# Patient Record
Sex: Female | Born: 1990 | Race: White | Hispanic: No | Marital: Single | Smoking: Current some day smoker
Health system: Southern US, Community
[De-identification: ages and names within clinical notes are randomized; demographics above are authoritative.]

## PROBLEM LIST (undated history)

## (undated) HISTORY — PX: NO PAST SURGERIES: SHX2092

---

## 2008-02-08 ENCOUNTER — Emergency Department: Payer: Self-pay | Admitting: Emergency Medicine

## 2010-10-08 ENCOUNTER — Emergency Department: Payer: Self-pay | Admitting: Emergency Medicine

## 2012-02-18 ENCOUNTER — Ambulatory Visit (INDEPENDENT_AMBULATORY_CARE_PROVIDER_SITE_OTHER): Payer: BC Managed Care – PPO | Admitting: Family Medicine

## 2012-02-18 VITALS — BP 98/78 | HR 75 | Temp 98.2°F | Resp 16 | Ht 67.0 in | Wt 131.0 lb

## 2012-02-18 DIAGNOSIS — J029 Acute pharyngitis, unspecified: Secondary | ICD-10-CM

## 2012-02-18 MED ORDER — AMOXICILLIN 875 MG PO TABS: 875.0000 mg | ORAL_TABLET | Freq: Two times a day (BID) | ORAL | Status: DC

## 2012-02-18 NOTE — Patient Instructions (Addendum)
Peritonsillar Cellulitis Peritonsillar cellulitis is an infection around a tonsil. This infection usually affects just one of the two tonsils. The result is a severe sore throat. Peritonsillar cellulitis can develop at any age. It often develops in individuals who have had frequent sore throats and who have frequently taken antibiotics. CAUSES  Peritonsillar cellulitis is usually caused by more than one type of germ (bacteria). SYMPTOMS  At first, it might seem like a regular sore throat. But a sore throat from peritonsillar cellulitis does not go away in a few days. Instead, it gets worse.  Early symptoms of peritonsillar cellulitis may include:  Fever and/or chills.  A throat that is sore on one side only.  Pain in one ear.  Pain when swallowing.  Feeling more tired than usual.  Later symptoms may include:  Severe pain when swallowing.  Drooling.  Trouble opening the mouth wide.  Bad breath.  Voice changes. DIAGNOSIS  In most cases, your caregiver can make the diagnosis by knowing your symptoms, examining your throat and getting a throat culture. Blood samples may also help to determine the cause of your sore throat. TREATMENT  This is not an ordinary sore throat. It is a condition that needs to be treated quickly. If it is not treated, swelling and pus (an abcess) can develop.  Peritonsillar cellulitis is usually treated with antibiotics. These infections require oral antibiotics for a full 10 days and/or antibiotics given into the vein (intravenous, IV).  Medications may be prescribed for pain or fever.  Sometimes, medications that fight swelling (steroids) are prescribed.  If an abscess has formed, the abscess may need to be drained.  Individuals who have repeated cases of peritonsillar cellulitis may need an operation to remove the tonsils (tonsillectomy). HOME CARE INSTRUCTIONS   Take antibiotics as directed by your caregiver. Take all the antibiotics, even if you  start to feel better.  Some pain is normal with this condition. Take pain medication as directed by your caregiver. Do not take any other pain medications unless approved by your caregiver.  Gargle with warm salt water. Use 1 teaspoon (5 grams) salt mixed in 1 cup (250 mL) of warm water. Gargle for 30 seconds or more before spitting the solution out. Gargle 3 to 4 times a day or as needed. This may help ease pain and swelling.  A liquid or soft food diet may be necessary if it is hard to swallow.  It is important to drink fluids. Drink enough water and fluids to keep your urine clear or pale yellow.  Do not smoke.  Rest and get plenty of sleep.  If your caregiver has given you a follow-up appointment, it is very important to keep that appointment. Your caregiver will need to make sure that the infection is getting better. It is important to check that an abscess is not forming.  Return to work or school as directed by your caregiver. SEEK MEDICAL CARE IF:   Your swelling increases.  You have difficulty swallowing.  You are unable to take your antibiotic. SEEK IMMEDIATE MEDICAL CARE IF:   You have trouble breathing.  Your pain gets worse even after taking pain medicine.  You see pus around or near the tonsils.  Your voice changes.  You are drooling.  You cough up bloody sputum.  You are unable to swallow.  You have a fever. MAKE SURE YOU:   Understand these instructions.  Will watch your condition.  Will get help right away if you are   not doing well or get worse. Document Released: 07/24/2009 Document Revised: 07/22/2011 Document Reviewed: 07/24/2009 Exit[employer] Patient Information 2013 [employer].  

## 2012-02-18 NOTE — Progress Notes (Signed)
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  Associated with headaches.  Unknown whether fever present.  Decreased appetite, no nausea or vomiting.  Some cough since last night.  Objective:  NAD  TM's  Dull Oroph:  Red without exudates or swelling Neck:  Supple, mild ant. Cervical gland swelling Skin: warm and dry  Assessment:  Acute pharyngitis  1. Pharyngitis  Culture, Group A Strep, amoxicillin (AMOXIL) 875 MG tablet

## 2012-02-20 LAB — CULTURE, GROUP A STREP: Organism ID, Bacteria: NORMAL

## 2012-09-30 IMAGING — CT CT ABD-PELV W/ CM
1 of 2 series · 16 of 32 positions shown, 20 images · non-contrast
Comparison: none

REASON FOR EXAM: (1) RLQ PAIN; (2) RLQ PAIN
COMMENTS:

PROCEDURE:     CT  - CT ABDOMEN / PELVIS  W  - October 08, 2010  [DATE]
RESULT:     History: Right lower quadrant pain.

[Series 2: 3mm soft tissue · axial · 0.65mm/px · z∈[-508,-88]mm · 16 of 154 slices shown, 20 images]
[im 7/154  soft-tissue]
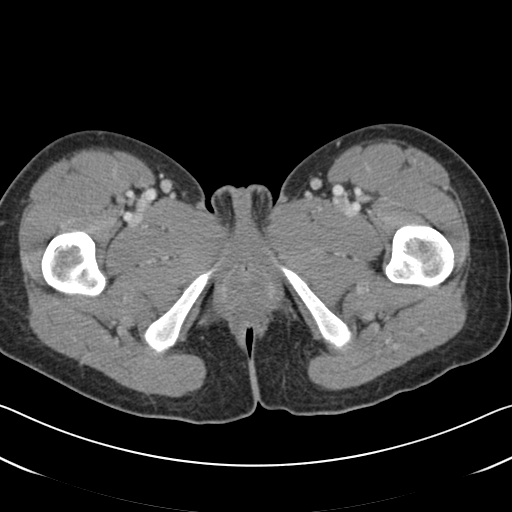
[im 7/154  bone]
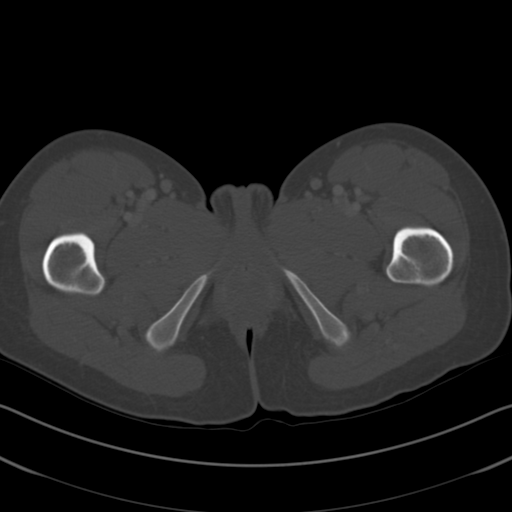
[im 20/154  soft-tissue]
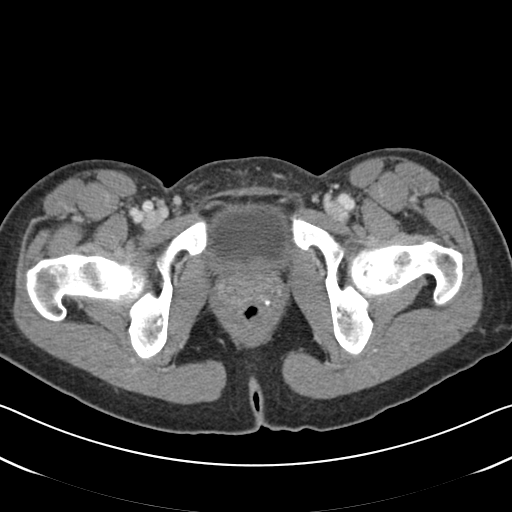
[im 32/154  soft-tissue]
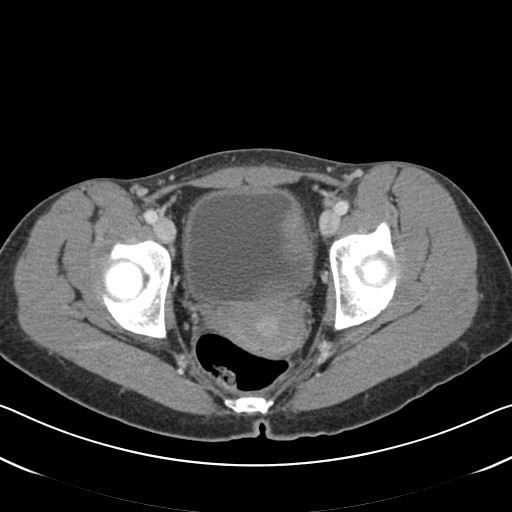
[im 39/154  soft-tissue]
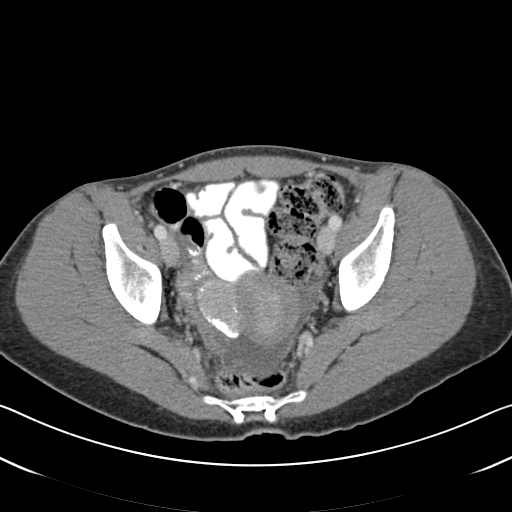
[im 52/154  soft-tissue]
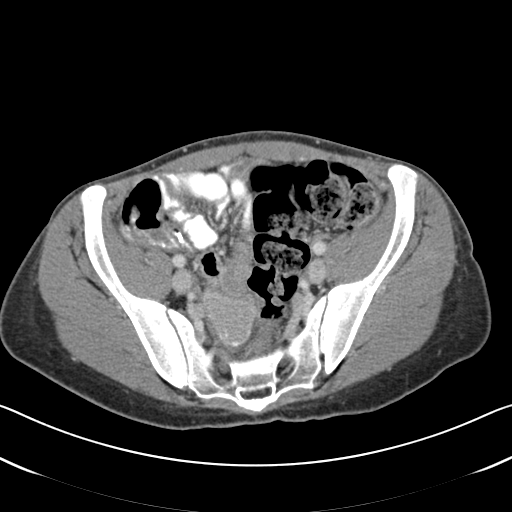
[im 64/154  soft-tissue]
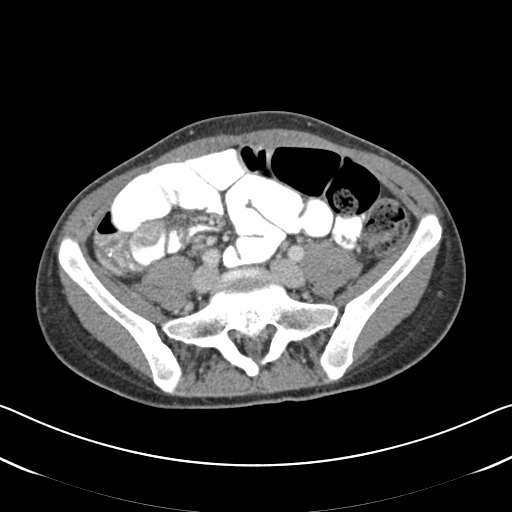
[im 71/154  soft-tissue]
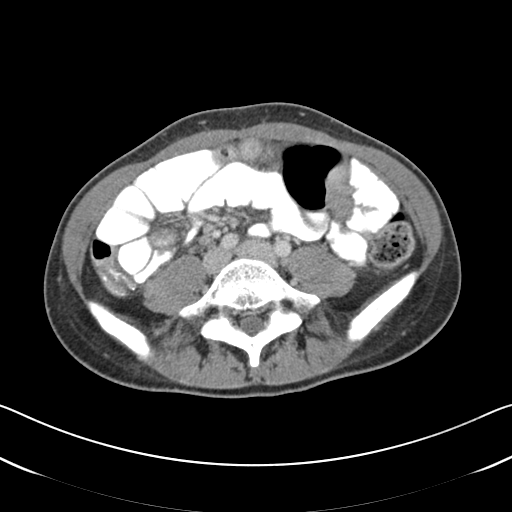
[im 83/154  soft-tissue]
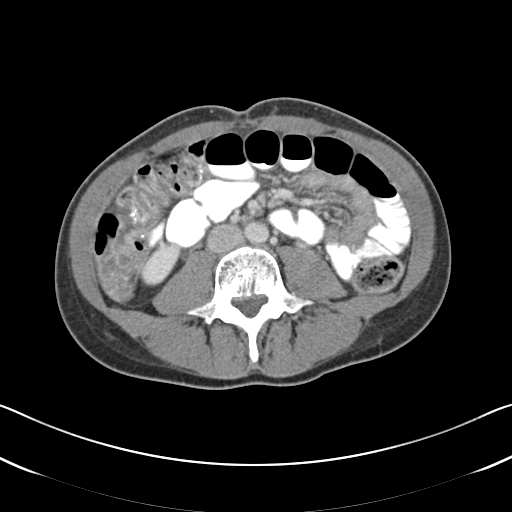
[im 90/154  soft-tissue]
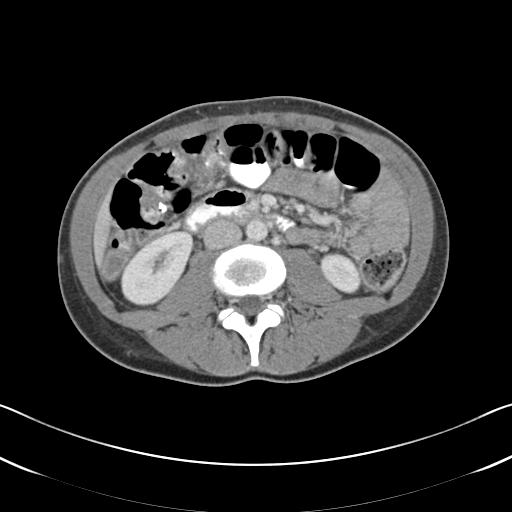
[im 90/154  bone]
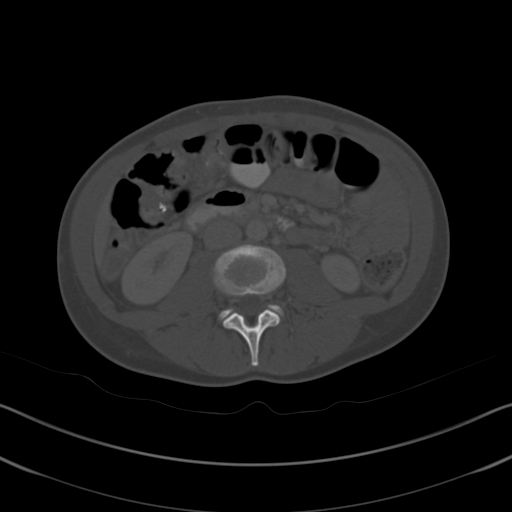
[im 103/154  soft-tissue]
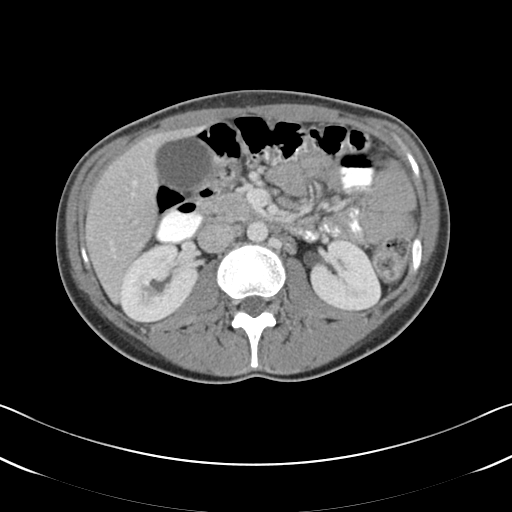
[im 115/154  soft-tissue]
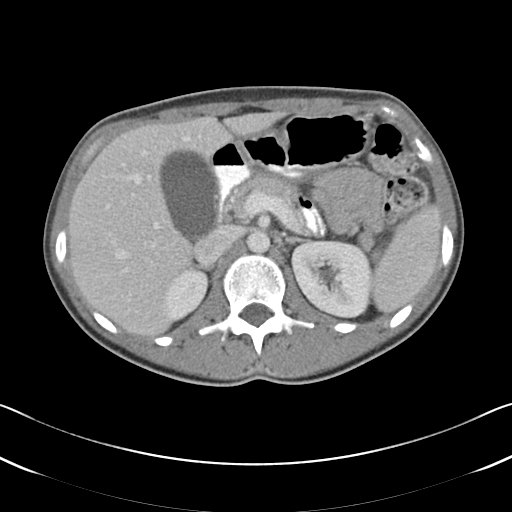
[im 122/154  soft-tissue]
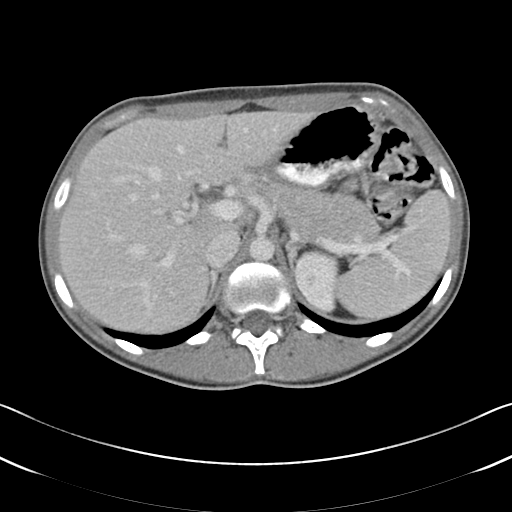
[im 128/154  lung]
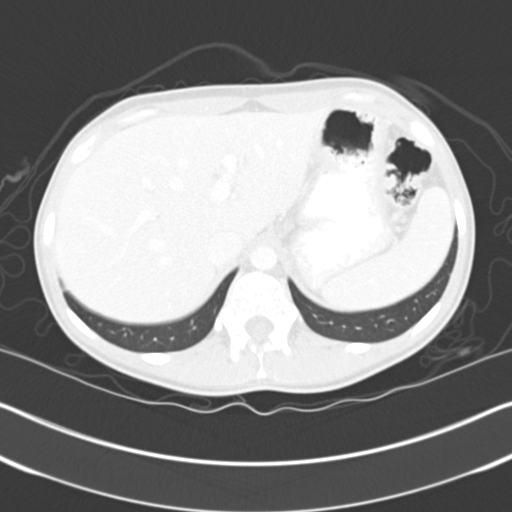
[im 134/154  soft-tissue]
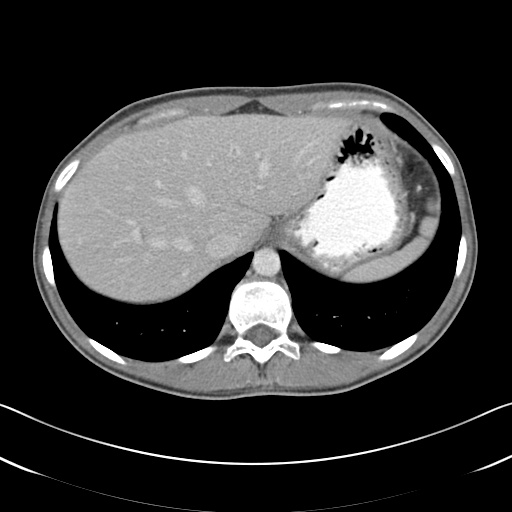
[im 134/154  lung]
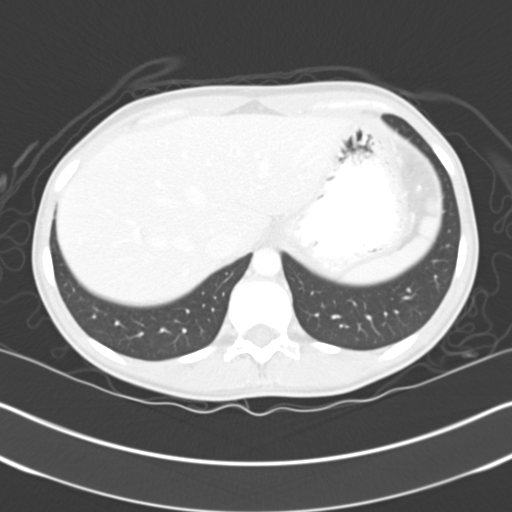
[im 141/154  lung]
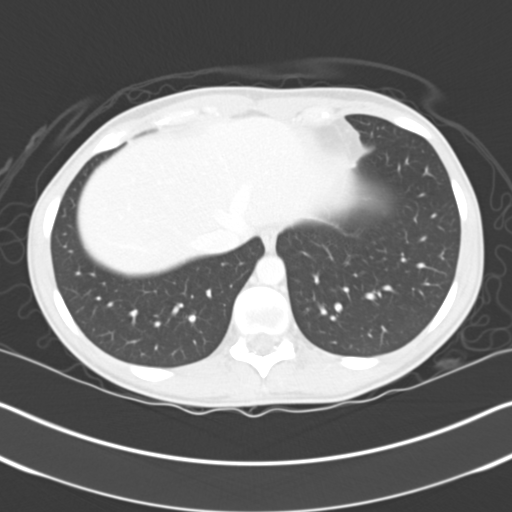
[im 147/154  soft-tissue]
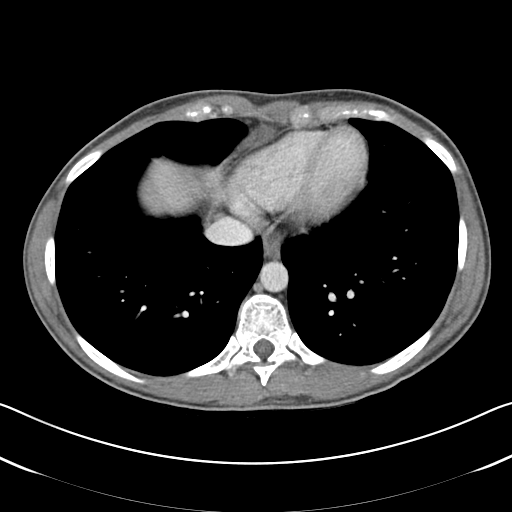
[im 147/154  lung]
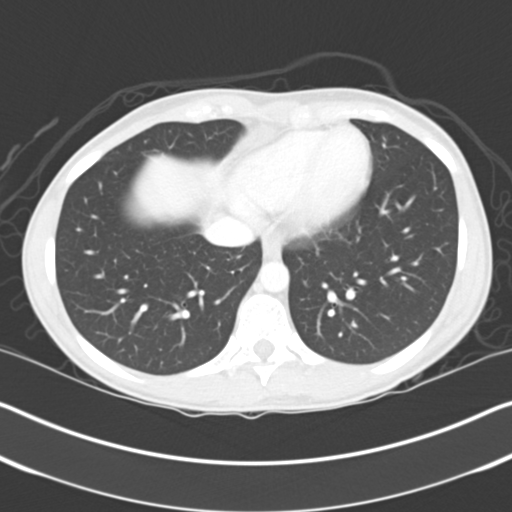

[16 of 32 positions shown; findings below may reference images not displayed]

FINDINGS: Standard CT obtained with 100 cc of 7sovue-TGC. Liver is normal.
Minimal biliary ductal prominence is noted. Gallbladder is nondistended.
Spleen is normal. Pancreas is normal. Adrenals normal. Kidneys normal. Free
pelvic fluid is noted. There is no bowel distention. The appendix is
difficult to visualize but what appears to be the appendix contains contrast
and air and is most likely normal. There is no evidence of bowel
obstruction. There is no free air. Lung bases clear. Aorta nondistended.
Stool is noted throughout the colon. Lung bases are clear.
IMPRESSION: Free fluid noted in the pelvis. Exam is otherwise
nonspecific.

## 2014-09-28 ENCOUNTER — Ambulatory Visit
Admission: EM | Admit: 2014-09-28 | Discharge: 2014-09-28 | Disposition: A | Discharge status: Home / Self Care | Payer: Managed Care, Other (non HMO) | Attending: Family Medicine | Admitting: Family Medicine

## 2014-09-28 DIAGNOSIS — N390 Urinary tract infection, site not specified: Secondary | ICD-10-CM

## 2014-09-28 LAB — URINALYSIS COMPLETE WITH MICROSCOPIC (ARMC ONLY)
BILIRUBIN URINE: NEGATIVE
GLUCOSE, UA: NEGATIVE mg/dL
Ketones, ur: NEGATIVE mg/dL
NITRITE: NEGATIVE
Protein, ur: 30 mg/dL — AB
Specific Gravity, Urine: 1.01 (ref 1.005–1.030)
Squamous Epithelial / LPF: NONE SEEN — AB
pH: 6.5 (ref 5.0–8.0)

## 2014-09-28 MED ORDER — CIPROFLOXACIN HCL 500 MG PO TABS: 500.0000 mg | ORAL_TABLET | Freq: Two times a day (BID) | ORAL | Status: DC

## 2014-09-28 MED ORDER — FLUCONAZOLE 150 MG PO TABS: 150.0000 mg | ORAL_TABLET | Freq: Every day | ORAL | Status: DC

## 2014-09-28 NOTE — ED Provider Notes (Signed)
CSN: [id number]     Arrival date & time 09/28/14  1309 History   First MD Initiated Contact with Patient 09/28/14 1332     Chief Complaint  Patient presents with  . Urinary Tract Infection   (Consider location/radiation/quality/duration/timing/severity/associated sxs/prior Treatment) HPI Comments: Patient presents with a h/o frequent urination since last night. Denies any fevers, chills, nausea, vomiting, back pain. States has had 2 prior UTIs in the last 4-5 months.   The history is provided by the patient.    No past medical history on file. Past Surgical History  Procedure Laterality Date  . No past surgeries     No family history on file. History  Substance Use Topics  . Smoking status: Never Smoker   . Smokeless tobacco: Not on file  . Alcohol Use: 0.0 oz/week    0 Standard drinks or equivalent per week     Comment: occasionally   OB History    No data available     Review of Systems  Allergies  Omnicef  Home Medications   Prior to Admission medications   Medication Sig Start Date End Date Taking? Authorizing Provider  levonorgestrel-ethinyl estradiol (SEASONALE,INTROVALE,JOLESSA) 0.15-0.03 MG tablet Take 1 tablet by mouth daily.   Yes Historical Provider, MD  amoxicillin (AMOXIL) 875 MG tablet Take 1 tablet (875 mg total) by mouth 2 (two) times daily. 02/18/12   Elvina SidleKurt Lauenstein, MD  ciprofloxacin (CIPRO) 500 MG tablet Take 1 tablet (500 mg total) by mouth every 12 (twelve) hours. 09/28/14   Payton Mccallumrlando Alfard Cochrane, MD  fluconazole (DIFLUCAN) 150 MG tablet Take 1 tablet (150 mg total) by mouth daily. 09/28/14   Payton Mccallumrlando Tanita Palinkas, MD   BP 112/81 mmHg  Pulse 84  Temp(Src) 97.6 F (36.4 C) (Tympanic)  Resp 18  Ht 5\' 7"  (1.702 m)  Wt 130 lb (58.968 kg)  BMI 20.36 kg/m2  SpO2 100%  LMP 09/07/2014 Physical Exam  Constitutional: She appears well-developed and well-nourished. No distress.  Abdominal: Soft. Bowel sounds are normal. She exhibits no distension and no mass. There is  no tenderness. There is no rebound and no guarding.  No CVA tenderness  Skin: She is not diaphoretic.  Nursing note and vitals reviewed.   ED Course  Procedures (including critical care time) Labs Review Labs Reviewed  URINALYSIS COMPLETEWITH MICROSCOPIC [facility])  - Abnormal; Notable for the following:    APPearance CLOUDY (*)    Hgb urine dipstick 2+ (*)    Protein, ur 30 (*)    Leukocytes, UA 3+ (*)    Bacteria, UA FEW (*)    Squamous Epithelial / LPF NONE SEEN (*)    All other components within normal limits  URINE CULTURE    Imaging Review No results found.   MDM   1. UTI (lower urinary tract infection)    Discharge Medication List as of 09/28/2014  2:16 PM    START taking these medications   Details  ciprofloxacin (CIPRO) 500 MG tablet Take 1 tablet (500 mg total) by mouth every 12 (twelve) hours., Starting 09/28/2014, Until Discontinued, Normal    fluconazole (DIFLUCAN) 150 MG tablet Take 1 tablet (150 mg total) by mouth daily., Starting 09/28/2014, Until Discontinued, Normal      Plan: 1. Test/x-ray results and diagnosis reviewed with patient 2. rx as per orders; risks, benefits, potential side effects reviewed with patient 3. Recommend supportive treatment with increased fluids; establish care with PCP 4. F/u prn if symptoms worsen or don't improve    Payton Mccallumrlando Tyren Dugar, MD  09/28/14 1616 

## 2014-09-28 NOTE — Discharge Instructions (Signed)

## 2014-09-28 NOTE — ED Notes (Signed)
Patient states that she has had two UTI's in the last 5 months. Patient states that she is having urinary urgency with nocturia. Patient states that she thinks she is at the early stage. She states that she has been taking a preventative Cranberry pill.

## 2014-09-30 LAB — URINE CULTURE
Culture: >=100000
SPECIAL REQUESTS: NORMAL

## 2017-11-18 ENCOUNTER — Encounter: Payer: Self-pay | Admitting: Emergency Medicine

## 2017-11-18 ENCOUNTER — Other Ambulatory Visit: Payer: Self-pay

## 2017-11-18 ENCOUNTER — Ambulatory Visit
Admission: EM | Admit: 2017-11-18 | Discharge: 2017-11-18 | Disposition: A | Discharge status: Home / Self Care | Payer: BLUE CROSS/BLUE SHIELD | Attending: Family Medicine | Admitting: Family Medicine

## 2017-11-18 DIAGNOSIS — N9089 Other specified noninflammatory disorders of vulva and perineum: Secondary | ICD-10-CM | POA: Diagnosis not present

## 2017-11-18 DIAGNOSIS — Z113 Encounter for screening for infections with a predominantly sexual mode of transmission: Secondary | ICD-10-CM

## 2017-11-18 DIAGNOSIS — N898 Other specified noninflammatory disorders of vagina: Secondary | ICD-10-CM

## 2017-11-18 DIAGNOSIS — Z3202 Encounter for pregnancy test, result negative: Secondary | ICD-10-CM | POA: Diagnosis not present

## 2017-11-18 DIAGNOSIS — R300 Dysuria: Secondary | ICD-10-CM | POA: Diagnosis not present

## 2017-11-18 LAB — WET PREP, GENITAL
CLUE CELLS WET PREP: NONE SEEN
Sperm: NONE SEEN
Trich, Wet Prep: NONE SEEN
Yeast Wet Prep HPF POC: NONE SEEN

## 2017-11-18 LAB — URINALYSIS, COMPLETE (UACMP) WITH MICROSCOPIC
BILIRUBIN URINE: NEGATIVE
Glucose, UA: NEGATIVE mg/dL
Hgb urine dipstick: NEGATIVE
KETONES UR: NEGATIVE mg/dL
LEUKOCYTES UA: NEGATIVE
NITRITE: NEGATIVE
Protein, ur: NEGATIVE mg/dL
Specific Gravity, Urine: 1.02 (ref 1.005–1.030)
pH: 7 (ref 5.0–8.0)

## 2017-11-18 LAB — CHLAMYDIA/NGC RT PCR (ARMC ONLY)
CHLAMYDIA TR: NOT DETECTED
N GONORRHOEAE: NOT DETECTED

## 2017-11-18 LAB — PREGNANCY, URINE: PREG TEST UR: NEGATIVE

## 2017-11-18 MED ORDER — SULFAMETHOXAZOLE-TRIMETHOPRIM 800-160 MG PO TABS: 1.0000 | ORAL_TABLET | Freq: Two times a day (BID) | ORAL | 0 refills | Status: AC

## 2017-11-18 MED ORDER — VALACYCLOVIR HCL 1 G PO TABS: 1000.0000 mg | ORAL_TABLET | Freq: Two times a day (BID) | ORAL | 0 refills | Status: AC

## 2017-11-18 NOTE — Discharge Instructions (Signed)
Take medication as prescribed. Rest. Drink plenty of fluids. No sexual activity until all results received.   Follow up with your primary care physician this week as needed. Return to Urgent care for new or worsening concerns.

## 2017-11-18 NOTE — ED Provider Notes (Signed)
[facility] ____________________________________________  Time seen: Approximately 2:28 PM  I have reviewed the triage vital signs and the nursing notes.   HISTORY  Chief Complaint vaginal complaint  HPI Kendra Cruz is a 27 y.o. female presenting for evaluation of left-sided vaginal bumps that she reports has quickly came on in 2 days.  Denies any pain to the area or swelling.  States she does have some burning with urination but states unsure if it is from her urine or if it is from urine touching the skin.  Denies any vaginal discharge, vaginal odor, vaginal pain or pelvic pain.  Denies abdominal pain, back pain, fevers, recent sickness or known trigger.  Denies history of same in the past.  Reports sexually active with the same partner without changes.  On oral contraceptives, with maintained use.  Denies other aggravating or alleviating factors.  Reports otherwise feels well.  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 Denies recent sickness. Denies recent antibiotic use.    History reviewed. No pertinent past medical history. Denies  There are no active problems to display for this patient.   Past Surgical History:  Procedure Laterality Date  . NO PAST SURGERIES       No current facility-administered medications for this encounter.   Current Outpatient Medications:  .  levonorgestrel-ethinyl estradiol (SEASONALE,INTROVALE,JOLESSA) 0.15-0.03 MG tablet, Take 1 tablet by mouth daily., Disp: , Rfl:  .  sulfamethoxazole-trimethoprim (BACTRIM DS,SEPTRA DS) 800-160 MG tablet, Take 1 tablet by mouth 2 (two) times daily for 5 days., Disp: 10 tablet, Rfl: 0 .  valACYclovir (VALTREX) 1000 MG tablet, Take 1 tablet (1,000 mg total) by mouth 2 (two) times daily for 7 days., Disp: 14 tablet, Rfl: 0  Allergies Omnicef [cefdinir]  No family history on file.  Social History Social  History   Tobacco Use  . Smoking status: Never Smoker  Substance Use Topics  . Alcohol use: Yes    Alcohol/week: 0.0 oz    Comment: occasionally  . Drug use: No    Review of Systems Constitutional: No fever/chills Cardiovascular: Denies chest pain. Respiratory: Denies shortness of breath. Gastrointestinal: No abdominal pain.  No nausea, no vomiting.  No diarrhea.   Genitourinary: as above.  Musculoskeletal: Negative for back pain. Skin: Negative for rash.  ____________________________________________   PHYSICAL EXAM:  VITAL SIGNS: ED Triage Vitals  Enc Vitals Group     BP 11/18/17 1324 (!) 138/95     Pulse Rate 11/18/17 1324 80     Resp 11/18/17 1324 18     Temp 11/18/17 1324 99.1 F (37.3 C)     Temp Source 11/18/17 1324 Oral     SpO2 11/18/17 1324 100 %     Weight 11/18/17 1322 135 lb (61.2 kg)     Height 11/18/17 1322 5\' 8"  (1.727 m)     Head Circumference --      Peak Flow --      Pain Score 11/18/17 1322 0     Pain Loc --      Pain Edu? --      Excl. in GC? --     Constitutional: Alert and oriented. Well appearing and in no acute distress. ENT      Head: Normocephalic and atraumatic. Cardiovascular: Normal rate, regular rhythm. Grossly normal heart sounds.  Good peripheral circulation. Respiratory: Normal respiratory effort without tachypnea nor retractions. Breath sounds are clear and equal  bilaterally. No wheezes, rales, rhonchi. Gastrointestinal: Soft and nontender. No CVA tenderness. Pelvic: Pelvic exam completed with Mardella Layman RN at bedside as chaperone. External left inner labia: X4 5 less than 0.5 cm clustered ulcerative appearing lesions with mild surrounding erythema, no swelling, no other rash or skin changes noted.  Speculum: Mild whitish vaginal discharge, no bleeding, no foreign body. Musculoskeletal:  No midline cervical, thoracic or lumbar tenderness to palpation. Neurologic:  Normal speech and language.  Speech is normal. No gait instability.    Skin:  Skin is warm, dry and intact. No rash noted. Psychiatric: Mood and affect are normal. Speech and behavior are normal. Patient exhibits appropriate insight and judgment   ___________________________________________   LABS (all labs ordered are listed, but only abnormal results are displayed)  Labs Reviewed  WET PREP, GENITAL - Abnormal; Notable for the following components:      Result Value   WBC, Wet Prep HPF POC FEW (*)    All other components within normal limits  URINALYSIS, COMPLETE (UACMP) WITH MICROSCOPIC - Abnormal; Notable for the following components:   Bacteria, UA MANY (*)    All other components within normal limits  URINE CULTURE  CHLAMYDIA/NGC RT PCR ([facility] ONLY)  HSV CULTURE AND TYPING  PREGNANCY, URINE  HSV(HERPES SIMPLEX VRS) I + II AB-IGG  HSV(HERPES SIMPLEX VRS) I + II AB-IGM  RPR  HIV ANTIBODY (ROUTINE TESTING)  HEPATITIS PANEL, ACUTE   ____________________________________________  PROCEDURES Procedures   INITIAL IMPRESSION / ASSESSMENT AND PLAN / ED COURSE  Pertinent labs & imaging results that were available during my care of the patient were reviewed by me and considered in my medical decision making (see chart for details).  Well-appearing patient.  No acute distress.  Patient requests to have  STD testing including gonorrhea, chlamydia, trichomonas, herpes, HIV, syphilis and hepatitis.  Urine pregnancy test negative.  Urinalysis noted to have many bacteria, otherwise unremarkable urine, will culture urine.  Pelvic exam was completed.  Ulcerative appearing lesions, will empirically start on oral Valtrex.  Will also start on Bactrim as concerns of urine.  Will await results.  Encourage safe sex, pelvic rest, supportive care.Discussed indication, risks and benefits of medications with patient.  Discussed follow up with Primary care physician this week. Discussed follow up and return parameters including no resolution or any worsening  concerns. Patient verbalized understanding and agreed to plan.   ____________________________________________   FINAL CLINICAL IMPRESSION(S) / ED DIAGNOSES  Final diagnoses:  Vaginal lesion  Dysuria     ED Discharge Orders        Ordered    sulfamethoxazole-trimethoprim (BACTRIM DS,SEPTRA DS) 800-160 MG tablet  2 times daily     11/18/17 1449    valACYclovir (VALTREX) 1000 MG tablet  2 times daily     11/18/17 1449       Note: This dictation was prepared with Dragon dictation along with smaller phrase technology. Any transcriptional errors that result from this process are unintentional.         Renford Dills, NP 11/18/17 1527

## 2017-11-18 NOTE — ED Triage Notes (Signed)
Patient c/o bumps to vaginal area x 2 days ago. Denies pain or itching. Does have burning with urination.

## 2017-11-19 LAB — HEPATITIS PANEL, ACUTE
HCV Ab: <0.1 s/co ratio (ref 0.0–0.9)
HEP A IGM: NEGATIVE
HEP B C IGM: NEGATIVE
Hepatitis B Surface Ag: NEGATIVE

## 2017-11-19 LAB — HSV(HERPES SIMPLEX VRS) I + II AB-IGG
HSV 1 Glycoprotein G Ab, IgG: <0.91 index (ref 0.00–0.90)
HSV 2 Glycoprotein G Ab, IgG: <0.91 index (ref 0.00–0.90)

## 2017-11-19 LAB — HSV(HERPES SIMPLEX VRS) I + II AB-IGM: HSVI/II Comb IgM: <0.91 Ratio (ref 0.00–0.90)

## 2017-11-19 LAB — URINE CULTURE: CULTURE: NO GROWTH

## 2017-11-19 LAB — HIV ANTIBODY (ROUTINE TESTING W REFLEX): HIV Screen 4th Generation wRfx: NONREACTIVE

## 2017-11-19 LAB — RPR: RPR Ser Ql: NONREACTIVE

## 2017-11-20 LAB — HSV CULTURE AND TYPING

## 2018-06-23 ENCOUNTER — Encounter: Payer: Self-pay | Admitting: Emergency Medicine

## 2018-06-23 ENCOUNTER — Other Ambulatory Visit: Payer: Self-pay

## 2018-06-23 ENCOUNTER — Ambulatory Visit
Admission: EM | Admit: 2018-06-23 | Discharge: 2018-06-23 | Disposition: A | Discharge status: Home / Self Care | Payer: BLUE CROSS/BLUE SHIELD | Attending: Family Medicine | Admitting: Family Medicine

## 2018-06-23 DIAGNOSIS — J45901 Unspecified asthma with (acute) exacerbation: Secondary | ICD-10-CM

## 2018-06-23 DIAGNOSIS — Z8709 Personal history of other diseases of the respiratory system: Secondary | ICD-10-CM

## 2018-06-23 DIAGNOSIS — R05 Cough: Secondary | ICD-10-CM | POA: Diagnosis not present

## 2018-06-23 MED ORDER — PREDNISONE 10 MG PO TABS: ORAL_TABLET | ORAL | 0 refills | Status: DC

## 2018-06-23 MED ORDER — AZITHROMYCIN 250 MG PO TABS: ORAL_TABLET | ORAL | 0 refills | Status: DC

## 2018-06-23 MED ORDER — HYDROCOD POLST-CPM POLST ER 10-8 MG/5ML PO SUER: 5.0000 mL | Freq: Every evening | ORAL | 0 refills | Status: DC | PRN

## 2018-06-23 MED ORDER — ALBUTEROL SULFATE HFA 108 (90 BASE) MCG/ACT IN AERS: 2.0000 | INHALATION_SPRAY | RESPIRATORY_TRACT | 0 refills | Status: AC | PRN

## 2018-06-23 NOTE — ED Provider Notes (Signed)
[facility] ____________________________________________  Time seen: Approximately 9:47 AM  I have reviewed the triage vital signs and the nursing notes.   HISTORY  Chief Complaint Cough (APPT)  HPI Kendra Cruz is a 28 y.o. female presenting for evaluation of approximately 2 months of cough.  States she started off with a mild cold which is since resolved.  States the coughing is predominantly at night and does disrupt sleep.  Does report she has a history of asthma, and states at night she does intermittently hear herself wheeze.  Denies any other wheezing.  No longer has an inhaler to use for this.  Denies any chest pain or shortness of breath.  No hemoptysis.  States cough is overall dry, occasionally more wet sounding at night.  No accompanying fevers.  Has continued remain active without change in activity levels.  Unresolved with over-the-counter medication.  Reports otherwise doing well denies other complaints.  Patient's last menstrual period was 06/09/2018 (approximate).denies pregnancy,    past medical history asthma  There are no active problems to display for this patient.   Past Surgical History:  Procedure Laterality Date  . NO PAST SURGERIES       No current facility-administered medications for this encounter.   Current Outpatient Medications:  .  albuterol (PROVENTIL HFA;VENTOLIN HFA) 108 (90 Base) MCG/ACT inhaler, Inhale 2 puffs into the lungs every 4 (four) hours as needed for wheezing or shortness of breath., Disp: 1 Inhaler, Rfl: 0 .  azithromycin (ZITHROMAX Z-PAK) 250 MG tablet, Take 2 tablets (500 mg) on  Day 1,  followed by 1 tablet (250 mg) once daily on Days 2 through 5., Disp: 6 each, Rfl: 0 .  chlorpheniramine-HYDROcodone (TUSSIONEX PENNKINETIC ER) 10-8 MG/5ML SUER, Take 5 mLs by mouth at bedtime as needed for cough. do not drive or operate machinery while taking as can cause drowsiness., Disp: 50 mL, Rfl: 0 .  levonorgestrel-ethinyl  estradiol (SEASONALE,INTROVALE,JOLESSA) 0.15-0.03 MG tablet, Take 1 tablet by mouth daily., Disp: , Rfl:  .  predniSONE (DELTASONE) 10 MG tablet, Start 60 mg po day one, then 50 mg po day two, taper by 10 mg daily until complete., Disp: 21 tablet, Rfl: 0  Allergies Omnicef [cefdinir]  Family History  Problem Relation Age of Onset  . Healthy Mother   . Healthy Father     Social History Social History   Tobacco Use  . Smoking status: Never Smoker  . Smokeless tobacco: Never Used  Substance Use Topics  . Alcohol use: Yes    Alcohol/week: 0.0 standard drinks    Comment: occasionally  . Drug use: No    Review of Systems Constitutional: No fever ENT: No sore throat. Cardiovascular: Denies chest pain. Respiratory: Denies shortness of breath. Gastrointestinal: No abdominal pain.  Musculoskeletal: Negative for back pain. Skin: Negative for rash.   ____________________________________________   PHYSICAL EXAM:  VITAL SIGNS: ED Triage Vitals  Enc Vitals Group     BP 06/23/18 0900 116/82     Pulse Rate 06/23/18 0900 80     Resp 06/23/18 0900 14     Temp 06/23/18 0900 98.8 F (37.1 C)     Temp Source 06/23/18 0900 Oral     SpO2 06/23/18 0900 100 %     Weight 06/23/18 0857 135 lb (61.2 kg)     Height 06/23/18 0857 5' 7.5" (1.715 m)     Head Circumference --      Peak Flow --      Pain Score 06/23/18 0857  0     Pain Loc --      Pain Edu? --      Excl. in GC? --    Constitutional: Alert and oriented. Well appearing and in no acute distress. Eyes: Conjunctivae are normal. PERRL. EOMI. Head: Atraumatic. No sinus tenderness to palpation. No swelling. No erythema.  Ears: no erythema, normal TMs bilaterally.   Nose:No nasal congestion  Mouth/Throat: Mucous membranes are moist. No pharyngeal erythema. No tonsillar swelling or exudate.  Neck: No stridor.  No cervical spine tenderness to palpation. Hematological/Lymphatic/Immunilogical: No cervical  lymphadenopathy. Cardiovascular: Normal rate, regular rhythm. Grossly normal heart sounds.  Good peripheral circulation. Respiratory: Normal respiratory effort.  No retractions. No wheezes, rales or rhonchi. Good air movement.  Dry intermittent cough with bronchospasm.  Speaks in complete sentences. Musculoskeletal: Ambulatory with steady gait.  No lower extremity edema noted bilaterally. Neurologic:  Normal speech and language. No gait instability. Skin:  Skin appears warm, dry and intact. No rash noted. Psychiatric: Mood and affect are normal. Speech and behavior are normal. ___________________________________________   LABS (all labs ordered are listed, but only abnormal results are displayed)   PROCEDURES Procedures    INITIAL IMPRESSION / ASSESSMENT AND PLAN / ED COURSE  Pertinent labs & imaging results that were available during my care of the patient were reviewed by me and considered in my medical decision making (see chart for details).  Well-appearing patient.  No acute distress.  Suspect asthmatic bronchitis.  Will treat patient with oral prednisone taper, albuterol, PRN Tussionex and azithromycin.  Encourage rest, fluids, supportive care.Discussed indication, risks and benefits of medications with patient.  Discussed follow up with Primary care physician this week. Discussed follow up and return parameters including no resolution or any worsening concerns. Patient verbalized understanding and agreed to plan.   ____________________________________________   FINAL CLINICAL IMPRESSION(S) / ED DIAGNOSES  Final diagnoses:  Asthmatic bronchitis with acute exacerbation, unspecified asthma severity, unspecified whether persistent     ED Discharge Orders         Ordered    azithromycin (ZITHROMAX Z-PAK) 250 MG tablet     06/23/18 0928    predniSONE (DELTASONE) 10 MG tablet     06/23/18 0928    albuterol (PROVENTIL HFA;VENTOLIN HFA) 108 (90 Base) MCG/ACT inhaler  Every 4  hours PRN     06/23/18 0928    chlorpheniramine-HYDROcodone (TUSSIONEX PENNKINETIC ER) 10-8 MG/5ML SUER  At bedtime PRN     06/23/18 3810           Note: This dictation was prepared with Dragon dictation along with smaller phrase technology. Any transcriptional errors that result from this process are unintentional.         Renford Dills, NP 06/23/18 [id number]

## 2018-06-23 NOTE — Discharge Instructions (Signed)
Take medication as prescribed. Rest. Drink plenty of fluids.  ° °Follow up with your primary care physician this week as needed. Return to Urgent care for new or worsening concerns.  ° °

## 2018-06-23 NOTE — ED Triage Notes (Signed)
Patient c/o cough and chest congestion for 2 months.  Patient denies fevers.  ?

## 2019-01-14 ENCOUNTER — Ambulatory Visit
Admission: EM | Admit: 2019-01-14 | Discharge: 2019-01-14 | Disposition: A | Discharge status: Home / Self Care | Payer: BLUE CROSS/BLUE SHIELD | Attending: Emergency Medicine | Admitting: Emergency Medicine

## 2019-01-14 ENCOUNTER — Other Ambulatory Visit: Payer: Self-pay

## 2019-01-14 ENCOUNTER — Encounter: Payer: Self-pay | Admitting: Emergency Medicine

## 2019-01-14 ENCOUNTER — Ambulatory Visit
Admit: 2019-01-14 | Discharge: 2019-01-14 | Disposition: A | Discharge status: Home / Self Care | Payer: BLUE CROSS/BLUE SHIELD | Attending: Urgent Care | Admitting: Urgent Care

## 2019-01-14 DIAGNOSIS — R824 Acetonuria: Secondary | ICD-10-CM | POA: Diagnosis present

## 2019-01-14 DIAGNOSIS — R1011 Right upper quadrant pain: Secondary | ICD-10-CM | POA: Diagnosis present

## 2019-01-14 DIAGNOSIS — Z3202 Encounter for pregnancy test, result negative: Secondary | ICD-10-CM | POA: Diagnosis not present

## 2019-01-14 LAB — COMPREHENSIVE METABOLIC PANEL
ALT: 16 U/L (ref 0–44)
AST: 21 U/L (ref 15–41)
Albumin: 4.8 g/dL (ref 3.5–5.0)
Alkaline Phosphatase: 47 U/L (ref 38–126)
Anion gap: 13 (ref 5–15)
BUN: 13 mg/dL (ref 6–20)
CO2: 22 mmol/L (ref 22–32)
Calcium: 9.3 mg/dL (ref 8.9–10.3)
Chloride: 98 mmol/L (ref 98–111)
Creatinine, Ser: 0.67 mg/dL (ref 0.44–1.00)
GFR calc Af Amer: >60 mL/min (ref >60)
GFR calc non Af Amer: >60 mL/min (ref >60)
Glucose, Bld: 78 mg/dL (ref 70–99)
Potassium: 3.6 mmol/L (ref 3.5–5.1)
Sodium: 133 mmol/L — ABNORMAL LOW (ref 135–145)
Total Bilirubin: 1.1 mg/dL (ref 0.3–1.2)
Total Protein: 7.8 g/dL (ref 6.5–8.1)

## 2019-01-14 LAB — CBC
HCT: 39.3 % (ref 36.0–46.0)
Hemoglobin: 13.5 g/dL (ref 12.0–15.0)
MCH: 32.4 pg (ref 26.0–34.0)
MCHC: 34.4 g/dL (ref 30.0–36.0)
MCV: 94.2 fL (ref 80.0–100.0)
Platelets: 182 10*3/uL (ref 150–400)
RBC: 4.17 MIL/uL (ref 3.87–5.11)
RDW: 11.9 % (ref 11.5–15.5)
WBC: 6.1 10*3/uL (ref 4.0–10.5)
nRBC: 0 % (ref 0.0–0.2)

## 2019-01-14 LAB — URINALYSIS, COMPLETE (UACMP) WITH MICROSCOPIC
Glucose, UA: NEGATIVE mg/dL
Hgb urine dipstick: NEGATIVE
Ketones, ur: >160 mg/dL — AB
Leukocytes,Ua: NEGATIVE
Nitrite: NEGATIVE
Protein, ur: NEGATIVE mg/dL
RBC / HPF: NONE SEEN RBC/hpf (ref 0–5)
Specific Gravity, Urine: 1.025 (ref 1.005–1.030)
pH: 6 (ref 5.0–8.0)

## 2019-01-14 LAB — LIPASE, BLOOD: Lipase: 25 U/L (ref 11–51)

## 2019-01-14 LAB — PREGNANCY, URINE: Preg Test, Ur: NEGATIVE

## 2019-01-14 NOTE — ED Notes (Signed)
Patient taken to ultrasound.

## 2019-01-14 NOTE — ED Provider Notes (Signed)
[place], [place]   Name: Kendra SamHannah Barro DOB: 1991-04-09 MRN: [id number] CSN: [id number] PCP: Patient, No Pcp Per  Arrival date and time:  01/14/19 1205  Chief Complaint:  Abdominal Pain and Urinary Urgency   NOTE: Prior to seeing the patient today, I have reviewed the triage nursing documentation and vital signs. Clinical staff has updated patient's PMH/PSHx, current medication list, and drug allergies/intolerances to ensure comprehensive history available to assist in medical decision making.   History:   HPI: Kendra Cruz is a 28 y.o. female who presents today with complaints of pain in the RIGHT upper quadrant of her abdomen that has been going on, off and on, for the last 1 month. Patient notes that over the course of the last week, pain has been more persistent. Pain is described as "dull" in nature. Patient notes an exacerbation of her pain last night that woke her from her sleep. She denies any pain in her chest or episodes of shortness of breath. Patient developed nausea and chills yesterday. She has not vomited. She denies any fevers. Patient denies pain being through and through to her back, or referred pain to RIGHT shoulder. Patient is eating only 1 meal a day. She works outside and is questions the adequacy of her hydration. Patient reporting urinary urgency, but no dysuria. She states, "I think that I had a UTI 2 months ago, but it was never treated".   History reviewed. No pertinent past medical history.  Past Surgical History:  Procedure Laterality Date  . NO PAST SURGERIES      Family History  Problem Relation Age of Onset  . Healthy Mother   . Healthy Father     Social History   Tobacco Use  . Smoking status: Current Some Day Smoker  . Smokeless tobacco: Never Used  Substance Use Topics  . Alcohol use: Yes    Alcohol/week: 0.0 standard drinks    Comment: occasionally  . Drug use: No    There are no active problems to display for this patient.   Home  Medications:    Current Meds  Medication Sig  . albuterol (PROVENTIL HFA;VENTOLIN HFA) 108 (90 Base) MCG/ACT inhaler Inhale 2 puffs into the lungs every 4 (four) hours as needed for wheezing or shortness of breath.    Allergies:   Omnicef [cefdinir]  Review of Systems (ROS): Review of Systems  Constitutional: Positive for chills. Negative for fever.  Respiratory: Negative for cough and shortness of breath.   Cardiovascular: Negative for chest pain and palpitations.  Gastrointestinal: Positive for abdominal pain and nausea. Negative for diarrhea and vomiting.  Genitourinary: Negative for dysuria, flank pain, frequency and urgency.  Musculoskeletal: Negative for back pain and neck pain.  All other systems reviewed and are negative.    Vital Signs: Today's Vitals   01/14/19 1229 01/14/19 1233 01/14/19 1453  BP:  122/89   Pulse:  88   Resp:  18   Temp:  98.5 F (36.9 C)   TempSrc:  Oral   SpO2:  99%   Weight: 130 lb (59 kg)    Height: 5\' 8"  (1.727 m)    PainSc: 4   4     Physical Exam: Physical Exam  Constitutional: She is oriented to person, place, and time and well-developed, well-nourished, and in no distress.  HENT:  Head: Normocephalic and atraumatic.  Mouth/Throat: Mucous membranes are normal.  Eyes: Pupils are equal, round, and reactive to light. EOM are normal.  Neck: Normal range of motion.  Neck supple. No tracheal deviation present.  Cardiovascular: Normal rate, regular rhythm, normal heart sounds and intact distal pulses. Exam reveals no gallop and no friction rub.  No murmur heard. Pulmonary/Chest: Effort normal and breath sounds normal. No respiratory distress. She has no wheezes. She has no rales.  Abdominal: Soft. Normal appearance. There is no hepatosplenomegaly. There is abdominal tenderness (mild) in the right upper quadrant. There is no CVA tenderness.  Lymphadenopathy:    She has no cervical adenopathy.  Neurological: She is alert and oriented to  person, place, and time. Gait normal. GCS score is 15.  Skin: Skin is warm and dry. No rash noted.  Psychiatric: Mood, memory, affect and judgment normal.  Nursing note and vitals reviewed.   Urgent Care Treatments / Results:   LABS: PLEASE NOTE: all labs that were ordered this encounter are listed, however only abnormal results are displayed. Labs Reviewed  URINALYSIS, COMPLETE (UACMP) WITH MICROSCOPIC - Abnormal; Notable for the following components:      Result Value   Bilirubin Urine SMALL (*)    Ketones, ur >160 (*)    Bacteria, UA RARE (*)    All other components within normal limits  COMPREHENSIVE METABOLIC PANEL - Abnormal; Notable for the following components:   Sodium 133 (*)    All other components within normal limits  PREGNANCY, URINE  CBC  LIPASE, BLOOD    EKG: -None  RADIOLOGY: US Abdomen Limited Ruq  Result Date: 01/14/2019 CLINICAL DATA:  Right upper quadrant pain for 1 month which has worsened over the past week. EXAM: ULTRASOUND ABDOMEN LIMITED RIGHT UPPER QUADRANT COMPARISON:  None. FINDINGS: Gallbladder: There is a small amount of sludge within the gallbladder. No stones, wall thickening or pericholecystic fluid. Sonographer reports negative Murphy's sign. Common bile duct: Diameter: 0.4 cm. Liver: No focal lesion identified. Within normal limits in parenchymal echogenicity. Portal vein is patent on color Doppler imaging with normal direction of blood flow towards the liver. Other: None. IMPRESSION: Small volume of gallbladder sludge.  The exam is otherwise negative. Electronically Signed   By: Inge Rise M.D.   On: 01/14/2019 14:44    PROCEDURES: Procedures  MEDICATIONS RECEIVED THIS VISIT: Medications - No data to display  PERTINENT CLINICAL COURSE NOTES/UPDATES:   Initial Impression / Assessment and Plan / Urgent Care Course:  Pertinent labs & imaging results that were available during my care of the patient were personally reviewed by me and  considered in my medical decision making (see lab/imaging section of note for values and interpretations).  Kendra Cruz is a 28 y.o. female who presents to [facility] today with complaints of Abdominal Pain and Urinary Urgency   Patient is well appearing overall in clinic today. She does not appear to be in any acute distress. Presenting symptoms (see HPI) and exam as documented above. Pain in the abdomen is minimal. UA unremarkable the exception of the ketones in her urine. Dietary intake varies, however patient noting that she only eats once per day. Patient encouraged to liberalize her dietary intake and increase frequency of meals. She was advised to increase her fluid intake (water and ORS). CBC reveals no leukocytosis. LFTs and renal function normal. Lipase normal. Etiology of pain unknown. Discussed that pain could be related to her gallbladder or potentially musculoskeletal in nature. Patient has had some issues with reflux in the past, however she does not regularly use any interventions. Patient encouraged to try something like Tums to assess for improvement. She was also  encouraged to use APAP or IBU as needed for pain. If pain persists, she was encouraged to follow up with her PCP for consideration of imaging. Patient advising that she does not have a PCP and is requesting for [place] to be performed today. Will order as an outpatient.   Discussed need to establish care with primary physician for ongoing evaluation. I have reviewed the follow up and strict return precautions for any new or worsening symptoms. Patient is aware of symptoms that would be deemed urgent/emergent, and would thus require further evaluation either here or in the emergency department. At the time of discharge, she verbalized understanding and consent with the discharge plan as it was reviewed with her. All questions were fielded by provider and/or clinic staff prior to patient discharge.    ADDENDUM: RUQ ultrasound  revealed a small amount of cholecystic sludge. There was no evidence of cholecystitis, cholelithiasis, or choledocholithiasis. CBD normal at 0.4 cm. Results reviewed with patient.   Final Clinical Impressions / Urgent Care Diagnoses:   Final diagnoses:  Right upper quadrant abdominal pain  Ketonuria    New Prescriptions:  [place] Controlled Substance Registry consulted? Not Applicable  No orders of the defined types were placed in this encounter.   Recommended Follow up Care:  Patient encouraged to follow up with the following provider within the specified time frame, or sooner as dictated by the severity of her symptoms. As always, she was instructed that for any urgent/emergent care needs, she should seek care either here or in the emergency department for more immediate evaluation.  Follow-up Information    PCP In 1 week.   Why: General reassessment of symptoms if not improving        NOTE: This note was prepared using Scientist, clinical (histocompatibility and immunogenetics) along with smaller Lobbyist. Despite my best ability to proofread, there is the potential that transcriptional errors may still occur from this process, and are completely unintentional.    Verlee Monte, NP 01/14/19 1505

## 2019-01-14 NOTE — ED Triage Notes (Signed)
Patient c/o intermittent upper right abdominal pain that started 2 weeks ago. She states the last week the pain has gotten worse and it wakes her up from her sleep. She says yesterday she was nauseated and having chills. Denies urinary symptoms but states she thinks she may have had one about 2 months ago that was left untreated.

## 2019-01-14 NOTE — Discharge Instructions (Addendum)
It was very nice seeing you today in clinic. Thank you for entrusting me with your care.   Your labs look good. Make efforts to eat more regularly and increase your fluid intake. If you feel like your pain is worsening, please follow up with your regular doctor or the emergency department, for further evaluation and imaging.   Make arrangements to follow up with your regular doctor in 1 week for re-evaluation if not improving. If your symptoms/condition worsens, please seek follow up care either here or in the ER. Please remember, our [facility] providers are "right here with you" when you need [place].   Again, it was my pleasure to take care of you today. Thank you for choosing our clinic. I hope that you start to feel better quickly.   Honor Loh, MSN, APRN, FNP-C, CEN Advanced Practice Provider [facility]

## 2021-01-06 IMAGING — US US ABDOMEN LIMITED
1 series · 14 of 25 positions shown · non-contrast
Comparison: None.

CLINICAL DATA: Right upper quadrant pain for 1 month which has
worsened over the past week.

EXAM:
ULTRASOUND ABDOMEN LIMITED RIGHT UPPER QUADRANT

[Series 1: us abdomen limited · 0.19mm/px · 14 of 49 slices shown]
[im 1/49]
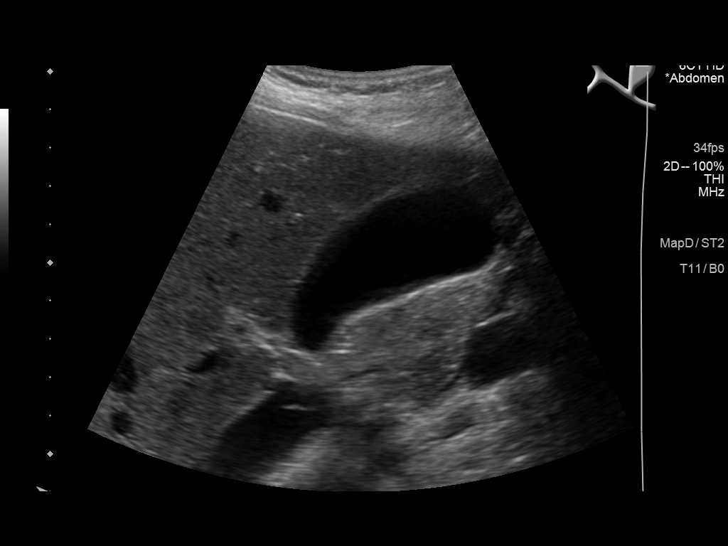
[im 5/49]
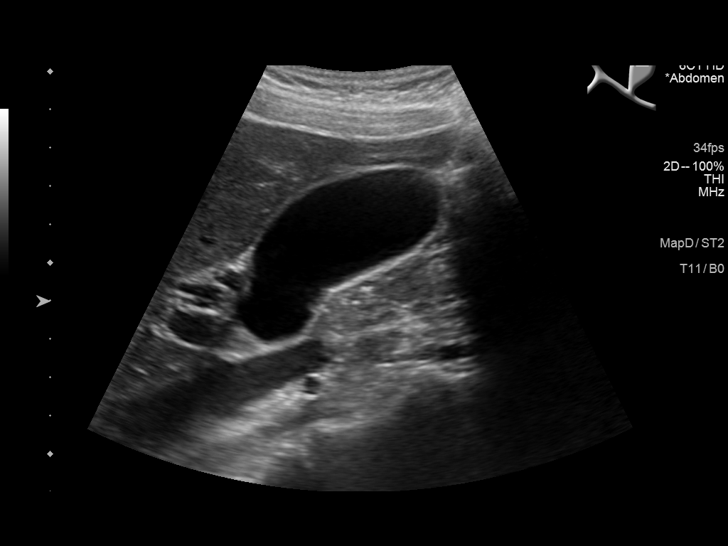
[im 9/49]
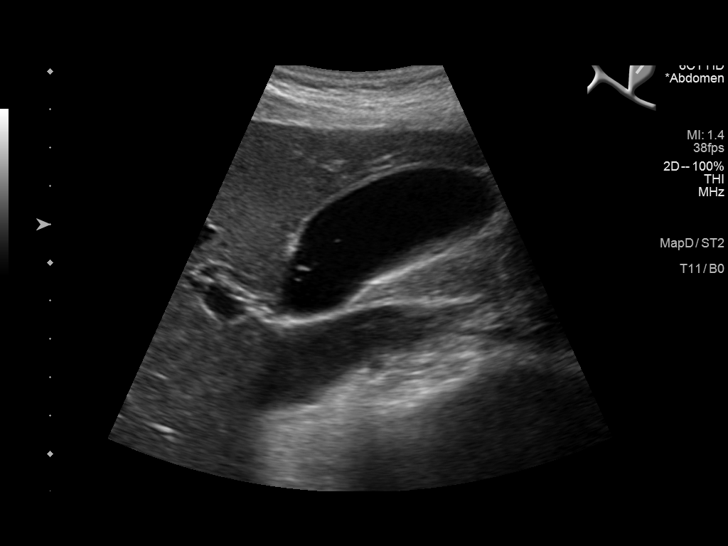
[im 13/49]
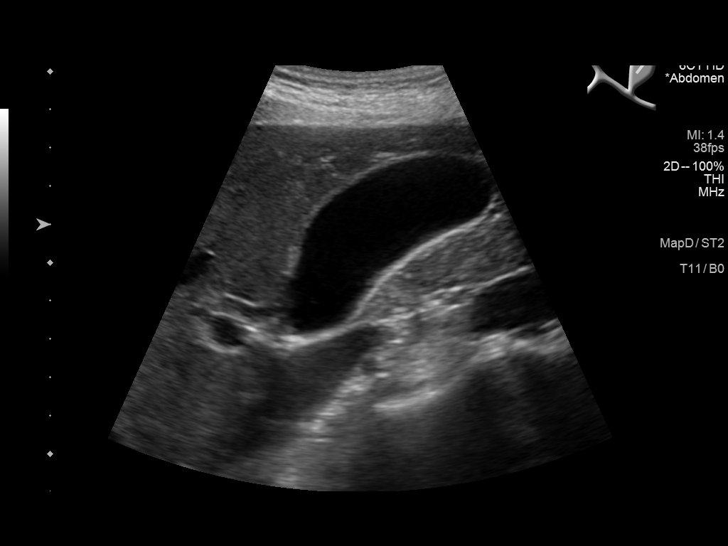
[im 17/49]
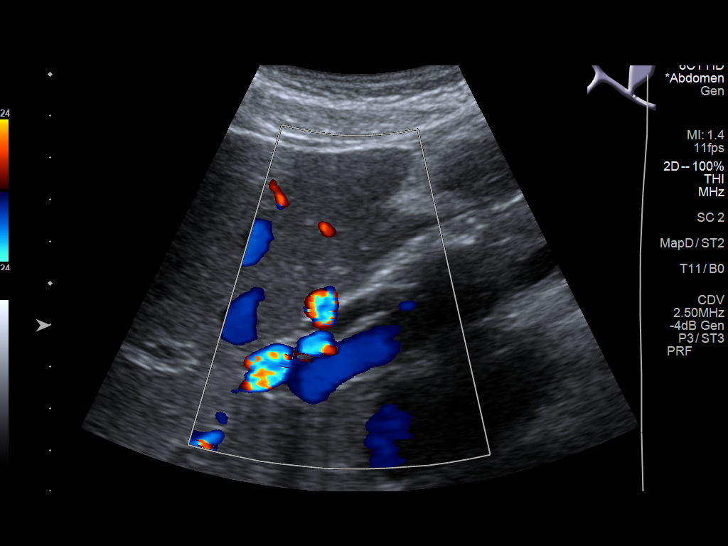
[im 19/49]
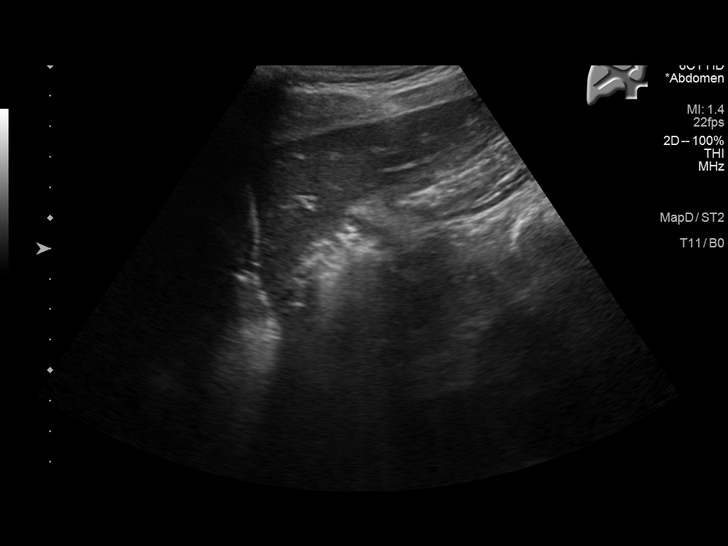
[im 23/49]
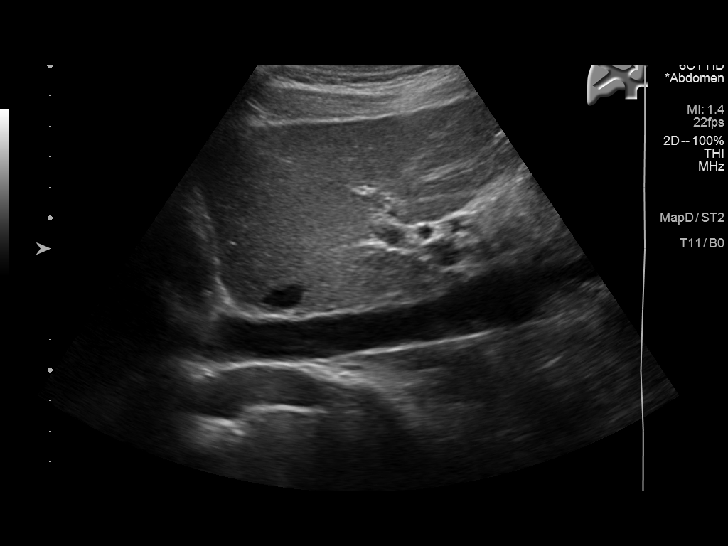
[im 27/49]
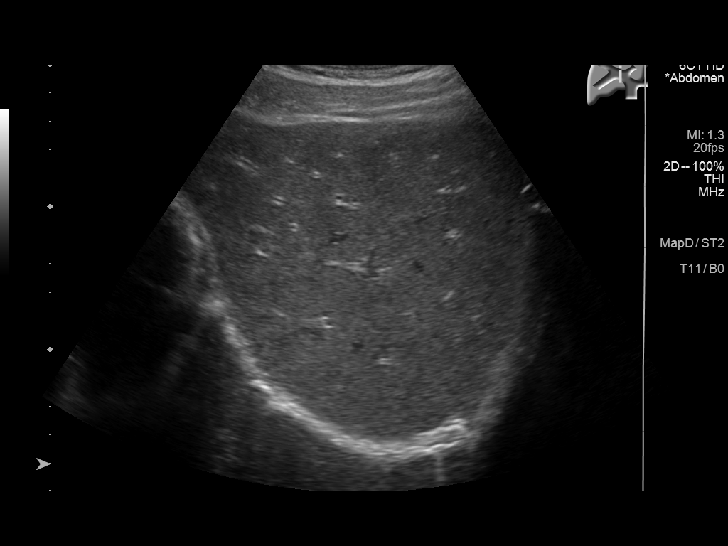
[im 31/49]
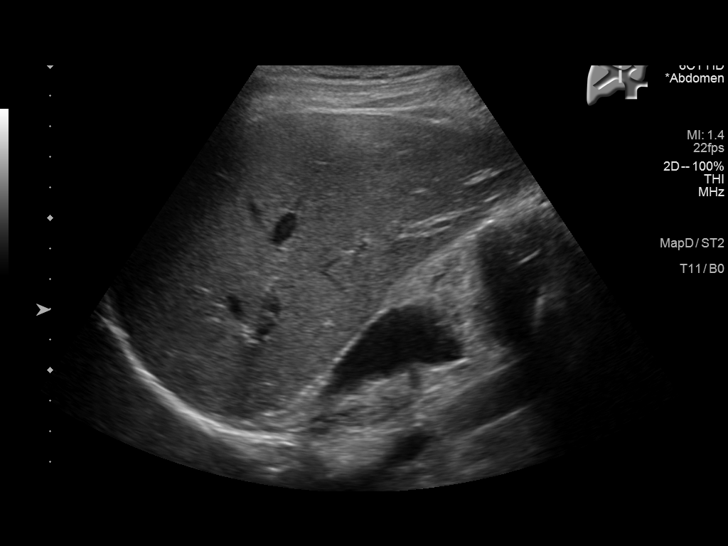
[im 33/49]
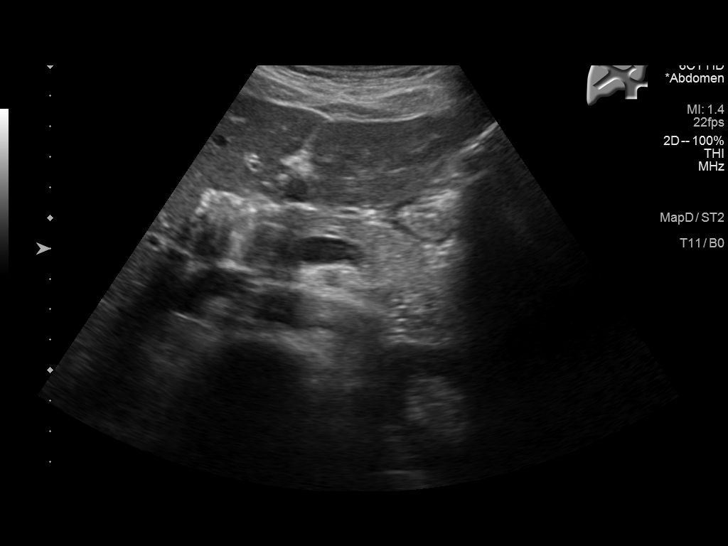
[im 37/49]
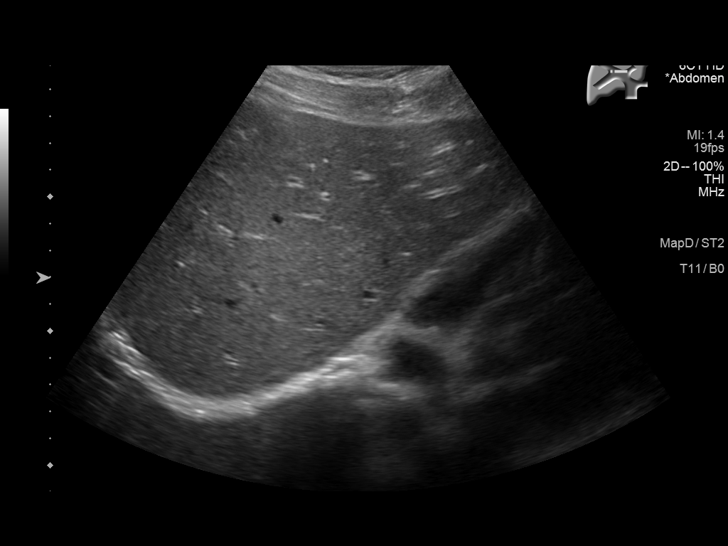
[im 41/49]
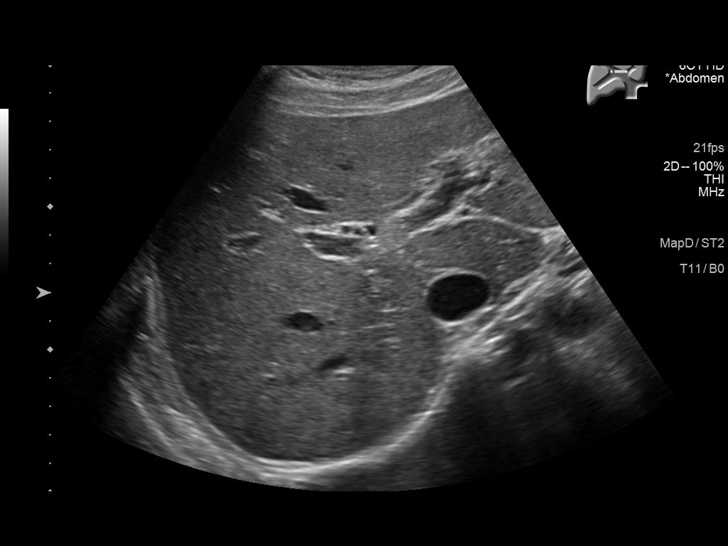
[im 45/49]
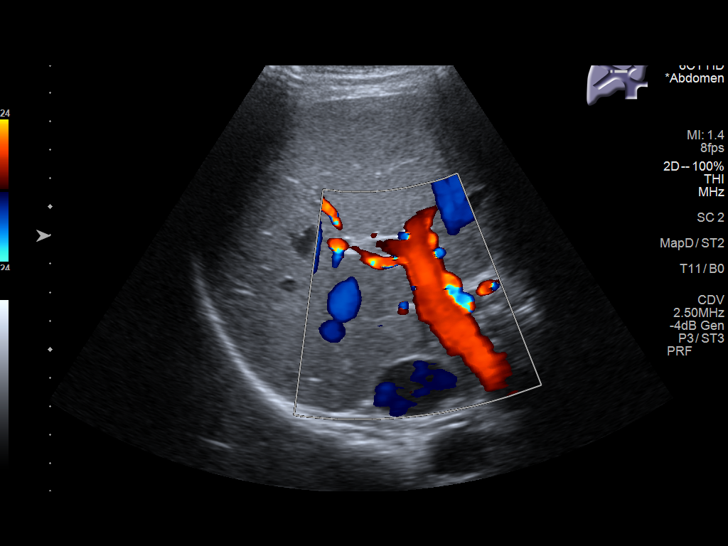
[im 49/49]
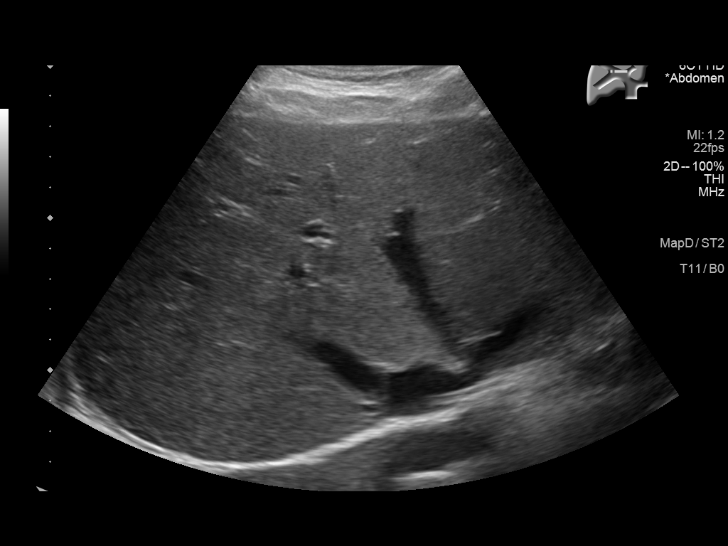

[14 of 25 positions shown; findings below may reference images not displayed]

FINDINGS: Gallbladder:

There is a small amount of sludge within the gallbladder. No stones,
wall thickening or pericholecystic fluid. Sonographer reports
negative Murphy's sign.

Common bile duct:

Diameter: 0.4 cm.

Liver:

No focal lesion identified. Within normal limits in parenchymal
echogenicity. Portal vein is patent on color Doppler imaging with
normal direction of blood flow towards the liver.

Other: None.
IMPRESSION: Small volume of gallbladder sludge.  The exam is otherwise negative.

## 2022-10-28 DIAGNOSIS — M79601 Pain in right arm: Secondary | ICD-10-CM | POA: Diagnosis not present
